# Patient Record
Sex: Female | Born: 1995 | Race: Black or African American | Hispanic: No | Marital: Single | Smoking: Never smoker
Health system: Southern US, Community
[De-identification: ages and names within clinical notes are randomized; demographics above are authoritative.]

## PROBLEM LIST (undated history)

## (undated) ENCOUNTER — Emergency Department (HOSPITAL_COMMUNITY): Discharge status: Absent without leave | Payer: Self-pay | Source: Home / Self Care

---
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
## 2021-04-05 DIAGNOSIS — U071 COVID-19: Secondary | ICD-10-CM | POA: Insufficient documentation

## 2021-08-04 ENCOUNTER — Emergency Department
Admission: EM | Admit: 2021-08-04 | Discharge: 2021-08-04 | Disposition: A | Discharge status: Home / Self Care | Payer: Medicaid Other | Attending: Emergency Medicine | Admitting: Emergency Medicine

## 2021-08-04 ENCOUNTER — Other Ambulatory Visit: Payer: Self-pay

## 2021-08-04 ENCOUNTER — Encounter: Payer: Self-pay | Admitting: Emergency Medicine

## 2021-08-04 DIAGNOSIS — L509 Urticaria, unspecified: Secondary | ICD-10-CM | POA: Insufficient documentation

## 2021-08-04 DIAGNOSIS — R21 Rash and other nonspecific skin eruption: Secondary | ICD-10-CM | POA: Diagnosis present

## 2021-08-04 MED ORDER — PREDNISONE 10 MG PO TABS: ORAL_TABLET | ORAL | 0 refills | Status: DC

## 2021-08-04 MED ORDER — FAMOTIDINE 20 MG PO TABS: 20.0000 mg | ORAL_TABLET | Freq: Two times a day (BID) | ORAL | 0 refills | Status: AC

## 2021-08-04 NOTE — ED Provider Notes (Signed)
? ?[facility] ?Provider Note ? ? ? Event Date/Time  ? First MD Initiated Contact with Patient 08/04/21 [phone]   ?  (approximate) ? ? ?History  ? ?Rash ? ? ?HPI ? ?Millena Callins is a 26 y.o. female presents to the ED with complaint of rash for the last 3 days.  Patient is not aware of any new allergens such as soap, detergents or foods.  Patient denies any previous allergy problems.  Patient has taken Benadryl with minimal relief.  She denies any shortness of breath or difficulty swallowing.  She reports that the rash is very itchy and then disappears but reappears in a different part of her body.  She denies any health problems or known allergies. ?  ? ? ?Physical Exam  ? ?Triage Vital Signs: ?ED Triage Vitals  ?Enc Vitals Group  ?   BP 08/04/21 0838 (!) 107/56  ?   Pulse Rate 08/04/21 0838 83  ?   Resp 08/04/21 0838 16  ?   Temp 08/04/21 0838 97.8 ?F (36.6 ?C)  ?   Temp Source 08/04/21 0838 Oral  ?   SpO2 08/04/21 0838 100 %  ?   Weight 08/04/21 0751 170 lb (77.1 kg)  ?   Height 08/04/21 0751 5\' 5"  (1.651 m)  ?   Head Circumference --   ?   Peak Flow --   ?   Pain Score 08/04/21 0751 0  ?   Pain Loc --   ?   Pain Edu? --   ?   Excl. in GC? --   ? ? ?Most recent vital signs: ?Vitals:  ? 08/04/21 0838  ?BP: (!) 107/56  ?Pulse: 83  ?Resp: 16  ?Temp: 97.8 ?F (36.6 ?C)  ?SpO2: 100%  ? ? ? ?General: Awake, no distress.  Able to speak in complete sentences without any difficulty. ?CV:  Good peripheral perfusion.  Heart regular rate and rhythm. ?Resp:  Normal effort.  Lungs are clear bilaterally. ?Abd:  No distention.  ?Other:  On examination of the skin there are raised, irregular macular areas diffusely over the body in various stages of erythema.  No vesicles or pustules were noted.  No edema noted oromucosa and uvula is midline. ? ? ?ED Results / Procedures / Treatments  ? ?Labs ?(all labs ordered are listed, but only abnormal results are displayed) ?Labs Reviewed - No data to  display ? ? ? ?PROCEDURES: ? ?Critical Care performed:  ? ?Procedures ? ? ?MEDICATIONS ORDERED IN ED: ?Medications - No data to display ? ? ?IMPRESSION / MDM / ASSESSMENT AND PLAN / ED COURSE  ?I reviewed the triage vital signs and the nursing notes. ? ? ?Differential diagnosis includes, but is not limited to, urticaria, contact dermatitis. ? ? ?26 year old female presents to the ED with a rash for the last 3 days that is extremely itchy.  Patient states that it comes and goes.  She denies any previous known allergies and has taken Benadryl with little relief.  On exam areas are consistent with urticaria.  We discussed the treatment of this and she is aware that the 2 medications sent to her pharmacy should help.  She is to follow-up with her PCP if any continued problems and may need to follow-up with an allergy specialist if it continues.  She is return to the emergency department if any severe worsening of her symptoms over the weekend. ? ? ?  ? ? ?FINAL CLINICAL IMPRESSION(S) / ED DIAGNOSES  ? ?  Final diagnoses:  ?Urticaria  ? ? ? ?Rx / DC Orders  ? ?ED Discharge Orders   ? ?      Ordered  ?  famotidine (PEPCID) 20 MG tablet  2 times daily       ? 08/04/21 1003  ?  predniSONE (DELTASONE) 10 MG tablet       ? 08/04/21 1003  ? ?  ?  ? ?  ? ? ? ?Note:  This document was prepared using Dragon voice recognition software and may include unintentional dictation errors. ?  ?Tommi Rumps, PA-C ?08/04/21 1009 ? ?  ?Jene Every, MD ?08/04/21 1052 ? ?

## 2021-08-04 NOTE — ED Triage Notes (Signed)
Pt via POV from home. Pt c/o itchy rash for the past 3 days. Denies pain. States it got worse this AM, denies SOB. Denies any new soaps, detergent. Pt is A&Ox4 and NAD ?

## 2021-08-04 NOTE — ED Notes (Signed)
Attempted to call patient at this time, phone number not in service. This RN visibly watched patient leave the building but did not states that she was not going to be seen.  ?

## 2021-08-04 NOTE — Discharge Instructions (Addendum)
Follow-up with your primary care provider and if this continues you may need to see an allergy specialist.  Prescriptions were sent to the pharmacy with the first one being Pepcid that you will take twice a day and the second one is prednisone that you begin taking 6 tablets today and tapering down over the next 6 days.  If any severe worsening of your symptoms return to the emergency department.  May also take Benadryl as needed for itching with these medications. ?

## 2021-08-04 NOTE — ED Notes (Signed)
Patient declined discharge vital signs. 

## 2021-08-07 ENCOUNTER — Encounter: Payer: Self-pay | Admitting: Emergency Medicine

## 2021-08-07 ENCOUNTER — Other Ambulatory Visit: Payer: Self-pay

## 2021-08-07 ENCOUNTER — Emergency Department
Admission: EM | Admit: 2021-08-07 | Discharge: 2021-08-07 | Disposition: A | Discharge status: Home / Self Care | Payer: Medicaid Other | Attending: Emergency Medicine | Admitting: Emergency Medicine

## 2021-08-07 DIAGNOSIS — T7840XA Allergy, unspecified, initial encounter: Secondary | ICD-10-CM | POA: Diagnosis not present

## 2021-08-07 NOTE — Discharge Instructions (Addendum)
-  Continue to take your prednisone, famotidine, and Benadryl as discussed. ?-Follow-up with the allergist listed above ?-You may additionally try over-the-counter hydrocortisone cream and/or Benadryl cream as well ? ?

## 2021-08-07 NOTE — ED Triage Notes (Signed)
Pt seen here Sunday for allergic reaction/Hives. States medication has not been helping. Still has hives on hands, arms and thighs.  ?

## 2021-08-07 NOTE — ED Notes (Signed)
See triage note  presents with possible allergic rxn  states she was seen 2 days ago  but feels like she is getting worse  positive itching  no resp distress ?

## 2021-08-07 NOTE — ED Provider Notes (Signed)
? ?[facility] ?Provider Note ? ? ? Event Date/Time  ? First MD Initiated Contact with Patient 08/07/21 [phone]   ?  (approximate) ? ? ?History  ? ?Chief Complaint ?Allergic Reaction ? ? ?HPI ?Brooke Hurley is a 26 y.o. female, no remarkable medical history, presents to the emergency department for evaluation of allergic reaction/hives.  Patient states that this been going on for the past week.  She was recently seen here on Sunday for the same symptoms.  She states that she is having recurrent splotches of urticaria scattered across her body, mostly in her upper extremities, abdomen, and lower extremities.  She states that she is currently taking prednisone and famotidine daily, she is in the middle of her treatment now but is not experiencing any significant relief.  Denies any new body washes, soaps, detergents, fabric softeners, or significant abdominal exposures.  Denies dysphagia, shortness of breath, chest pain, fever/chills, abdominal pain, flank pain, diarrhea, urinary symptoms, or dizziness/lightheadedness. ? ?History Limitations: No limitations. ? ?    ? ? ?Physical Exam  ?Triage Vital Signs: ?ED Triage Vitals  ?Enc Vitals Group  ?   BP 08/07/21 0250 124/74  ?   Pulse Rate 08/07/21 0250 77  ?   Resp 08/07/21 0250 16  ?   Temp 08/07/21 0250 97.8 ?F (36.6 ?C)  ?   Temp Source 08/07/21 0250 Oral  ?   SpO2 08/07/21 0250 99 %  ?   Weight 08/07/21 0305 170 lb (77.1 kg)  ?   Height 08/07/21 0305 5\' 5"  (1.651 m)  ?   Head Circumference --   ?   Peak Flow --   ?   Pain Score 08/07/21 0305 6  ?   Pain Loc --   ?   Pain Edu? --   ?   Excl. in GC? --   ? ? ?Most recent vital signs: ?Vitals:  ? 08/07/21 0250  ?BP: 124/74  ?Pulse: 77  ?Resp: 16  ?Temp: 97.8 ?F (36.6 ?C)  ?SpO2: 99%  ? ? ?General: Awake, NAD.  ?Skin: Warm, dry.  ?Eyes: PERRL. Conjunctivae normal.  ?CV: Good peripheral perfusion.  ?Resp: Normal effort.  Lung sounds are clear bilaterally in the apices and bases. ?Abd: Soft,  non-tender. No distention.  ?Neuro: At baseline. No gross neurological deficits.  ? ?Focused Exam: Patches of urticaria present along the patient's abdomen and upper extremities.  The chest/back appears to be spared at this time.  No surrounding warmth, erythema, or tenderness.  No lesions in the mouth or throat.  No stridor.  No angioedema. ? ?Physical Exam ? ? ? ?ED Results / Procedures / Treatments  ?Labs ?(all labs ordered are listed, but only abnormal results are displayed) ?Labs Reviewed - No data to display ? ? ?EKG ?N/A. ? ? ?RADIOLOGY ? ?ED Provider Interpretation: N/A. ? ?No results found. ? ?PROCEDURES: ? ?Critical Care performed: N/A. ? ?Procedures ? ? ? ?MEDICATIONS ORDERED IN ED: ?Medications - No data to display ? ? ?IMPRESSION / MDM / ASSESSMENT AND PLAN / ED COURSE  ?I reviewed the triage vital signs and the nursing notes. ?             ?               ? ?Differential diagnosis includes, but is not limited to, allergic reaction, contact dermatitis, cellulitis. ? ?ED Course ?Patient appears well, vitals within normal limits.  NAD. ? ?Assessment/Plan ?Presentation consistent with urticaria.  Unknown etiology  at this time.  No evidence of anaphylaxis.  Vital signs within normal limits.  She is currently in the middle of her prednisone and famotidine treatment.  Advised her to continue taking these treatments, as well as Benadryl as needed.  Recommended over-the-counter hydrocortisone cream and Benadryl cream as well.  We will provide her contact information to an allergist for further evaluation and management.  Will discharge. ? ?Provided the patient with anticipatory guidance, return precautions, and educational material. Encouraged the patient to return to the emergency department at any time if they begin to experience any new or worsening symptoms. Patient expressed understanding and agreed with the plan.  ? ?  ? ? ?FINAL CLINICAL IMPRESSION(S) / ED DIAGNOSES  ? ?Final diagnoses:  ?Allergic  reaction, initial encounter  ? ? ? ?Rx / DC Orders  ? ?ED Discharge Orders   ? ? None  ? ?  ? ? ? ?Note:  This document was prepared using Dragon voice recognition software and may include unintentional dictation errors. ?  ?Varney Daily, PA ?08/07/21 0730 ? ?  ?Concha Se, MD ?08/07/21 [phone] ? ?

## 2021-08-09 ENCOUNTER — Ambulatory Visit: Payer: Self-pay | Admitting: Physician Assistant

## 2021-09-29 DIAGNOSIS — Z419 Encounter for procedure for purposes other than remedying health state, unspecified: Secondary | ICD-10-CM | POA: Diagnosis not present

## 2022-08-02 ENCOUNTER — Ambulatory Visit
Admission: EM | Admit: 2022-08-02 | Discharge: 2022-08-02 | Disposition: A | Discharge status: Home / Self Care | Payer: Managed Care, Other (non HMO) | Attending: Nurse Practitioner | Admitting: Nurse Practitioner

## 2022-08-02 DIAGNOSIS — N760 Acute vaginitis: Secondary | ICD-10-CM | POA: Insufficient documentation

## 2022-08-02 LAB — POCT URINE PREGNANCY: Preg Test, Ur: NEGATIVE — AB

## 2022-08-02 MED ORDER — FLUCONAZOLE 150 MG PO TABS
150.0000 mg | ORAL_TABLET | Freq: Every day | ORAL | 0 refills | Status: DC
Start: 2022-08-02 — End: 2023-10-27

## 2022-08-02 NOTE — ED Provider Notes (Signed)
Renaldo Fiddler    CSN: [id number] Arrival date & time: 08/02/22  4098      History   Chief Complaint Chief Complaint  Patient presents with   Vaginal Itching    Entered by patient    HPI Raimey Whitefield is a 27 y.o. female presents for evaluation of vaginal itching. Pt reports 2 days of vaginal itching.  She reports discharge with itching that is not malodorous.  Denies any dysuria, fevers, nausea/vomiting, flank pain.  No STD exposure but she would like screening.  She took Monistat yesterday without improvement.  No other concerns at this time.   Vaginal Itching    History reviewed. No pertinent past medical history.  There are no problems to display for this patient.   History reviewed. No pertinent surgical history.  OB History   No obstetric history on file.      Home Medications    Prior to Admission medications   Medication Sig Start Date End Date Taking? Authorizing Provider  cetirizine (ZYRTEC) 5 MG tablet Take 5 mg by mouth daily.   Yes [provider]  fluconazole (DIFLUCAN) 150 MG tablet Take 1 tablet (150 mg total) by mouth daily. 08/02/22  Yes Radford Pax, NP  montelukast (SINGULAIR) 10 MG tablet Take 10 mg by mouth at bedtime.   Yes [provider]  famotidine (PEPCID) 20 MG tablet Take 1 tablet (20 mg total) by mouth 2 (two) times daily. 08/04/21 08/04/22  Tommi Rumps, PA-C  predniSONE (DELTASONE) 10 MG tablet Take 6 tablets  today, on day 2 take 5 tablets, day 3 take 4 tablets, day 4 take 3 tablets, day 5 take  2 tablets and 1 tablet the last day Patient not taking: Reported on 08/02/2022 08/04/21   Tommi Rumps, PA-C    Family History No family history on file.  Social History Social History   Tobacco Use   Smoking status: Never   Smokeless tobacco: Never  Substance Use Topics   Alcohol use: Not Currently   Drug use: Never     Allergies   Patient has no known allergies.   Review of Systems Review of  Systems  Genitourinary:  Positive for vaginal discharge.     Physical Exam Triage Vital Signs ED Triage Vitals  Enc Vitals Group     BP 08/02/22 1015 122/80     Pulse Rate 08/02/22 1015 90     Resp 08/02/22 1015 18     Temp 08/02/22 1015 97.9 F (36.6 C)     Temp Source 08/02/22 1015 Oral     SpO2 08/02/22 1015 98 %     Weight --      Height --      Head Circumference --      Peak Flow --      Pain Score 08/02/22 1013 0     Pain Loc --      Pain Edu? --      Excl. in GC? --    No data found.  Updated Vital Signs BP 122/80   Pulse 90   Temp 97.9 F (36.6 C) (Oral)   Resp 18   LMP 07/06/2022   SpO2 98%   Visual Acuity Right Eye Distance:   Left Eye Distance:   Bilateral Distance:    Right Eye Near:   Left Eye Near:    Bilateral Near:     Physical Exam Vitals and nursing note reviewed.  Constitutional:  Appearance: Normal appearance.  HENT:     Head: Normocephalic and atraumatic.  Eyes:     Pupils: Pupils are equal, round, and reactive to light.  Cardiovascular:     Rate and Rhythm: Normal rate.  Pulmonary:     Effort: Pulmonary effort is normal.  Abdominal:     Tenderness: There is no right CVA tenderness or left CVA tenderness.  Skin:    General: Skin is warm and dry.  Neurological:     General: No focal deficit present.     Mental Status: She is alert and oriented to person, place, and time.  Psychiatric:        Mood and Affect: Mood normal.        Behavior: Behavior normal.      UC Treatments / Results  Labs (all labs ordered are listed, but only abnormal results are displayed) Labs Reviewed  POCT URINE PREGNANCY - Abnormal; Notable for the following components:      Result Value   Preg Test, Ur Negative (*)    All other components within normal limits  RPR  HIV ANTIBODY (ROUTINE TESTING W REFLEX)  CERVICOVAGINAL ANCILLARY ONLY    EKG   Radiology No results found.  Procedures Procedures (including critical care  time)  Medications Ordered in UC Medications - No data to display  Initial Impression / Assessment and Plan / UC Course  I have reviewed the triage vital signs and the nursing notes.  Pertinent labs & imaging results that were available during my care of the patient were reviewed by me and considered in my medical decision making (see chart for details).     Reviewed exam and symptoms with patient.  No red flags. STD testing is ordered and will contact if positive Diflucan as prescribed Follow-up with PCP if symptoms do not improve ER precautions reviewed Final Clinical Impressions(s) / UC Diagnoses   Final diagnoses:  Acute vaginitis     Discharge Instructions      The clinic will contact you with results of the testing done today if positive Diflucan x 1 to treat yeast infection Follow-up with your PCP if symptoms or not improving    ED Prescriptions     Medication Sig Dispense Auth. Provider   fluconazole (DIFLUCAN) 150 MG tablet Take 1 tablet (150 mg total) by mouth daily. 1 tablet Radford Pax, NP      PDMP not reviewed this encounter.   Radford Pax, NP 08/02/22 1029

## 2022-08-02 NOTE — Discharge Instructions (Addendum)
The clinic will contact you with results of the testing done today if positive Diflucan x 1 to treat yeast infection Follow-up with your PCP if symptoms or not improving

## 2022-08-02 NOTE — ED Triage Notes (Signed)
Patient to Urgent Care with complaints of vaginal itching that started on Wednesday.   Used Monostat yesterday.

## 2022-08-03 LAB — HIV ANTIBODY (ROUTINE TESTING W REFLEX): HIV Screen 4th Generation wRfx: NONREACTIVE

## 2022-08-03 LAB — RPR: RPR Ser Ql: NONREACTIVE

## 2022-08-05 ENCOUNTER — Telehealth (HOSPITAL_COMMUNITY): Payer: Self-pay | Admitting: Emergency Medicine

## 2022-08-05 LAB — CERVICOVAGINAL ANCILLARY ONLY
Bacterial Vaginitis (gardnerella): POSITIVE — AB
Candida Glabrata: NEGATIVE
Candida Vaginitis: POSITIVE — AB
Chlamydia: NEGATIVE
Comment: NEGATIVE
Comment: NEGATIVE
Comment: NEGATIVE
Comment: NEGATIVE
Comment: NEGATIVE
Comment: NORMAL
Neisseria Gonorrhea: NEGATIVE
Trichomonas: NEGATIVE

## 2022-08-05 MED ORDER — METRONIDAZOLE 500 MG PO TABS: 500.0000 mg | ORAL_TABLET | Freq: Two times a day (BID) | ORAL | 0 refills | Status: DC

## 2023-10-27 ENCOUNTER — Other Ambulatory Visit: Payer: Self-pay

## 2023-10-27 ENCOUNTER — Encounter: Payer: Self-pay | Admitting: Emergency Medicine

## 2023-10-27 ENCOUNTER — Emergency Department

## 2023-10-27 ENCOUNTER — Emergency Department
Admission: EM | Admit: 2023-10-27 | Discharge: 2023-10-27 | Disposition: A | Discharge status: Home / Self Care | Attending: Emergency Medicine | Admitting: Emergency Medicine

## 2023-10-27 DIAGNOSIS — S93401A Sprain of unspecified ligament of right ankle, initial encounter: Secondary | ICD-10-CM | POA: Diagnosis not present

## 2023-10-27 DIAGNOSIS — S92254A Nondisplaced fracture of navicular [scaphoid] of right foot, initial encounter for closed fracture: Secondary | ICD-10-CM | POA: Insufficient documentation

## 2023-10-27 DIAGNOSIS — W109XXA Fall (on) (from) unspecified stairs and steps, initial encounter: Secondary | ICD-10-CM | POA: Insufficient documentation

## 2023-10-27 DIAGNOSIS — S99921A Unspecified injury of right foot, initial encounter: Secondary | ICD-10-CM | POA: Diagnosis present

## 2023-10-27 MED ORDER — IBUPROFEN 600 MG PO TABS: 600.0000 mg | ORAL_TABLET | Freq: Three times a day (TID) | ORAL | 0 refills | Status: AC | PRN

## 2023-10-27 NOTE — ED Triage Notes (Signed)
 Patient to ED via POV for right ankle injury. Pt reports falling down approx 10 stairs. Denies hitting head. States unable to put pressure on ankle. Using crutches on arrival to ED.

## 2023-10-27 NOTE — Discharge Instructions (Addendum)
 Call today and make an appointment with Dr. Malvin who also has office hours in [place].  Make sure that when you are calling the office that you identify that you need an appointment in [place] instead of [place].  Ice and elevation if needed for pain and swelling.  Wear the cam walker anytime you are up walking.  You do not have to wear it while you are sleeping.  Ibuprofen  was sent to the pharmacy for you to take with food daily to help with inflammation which will also help with pain.

## 2023-10-27 NOTE — ED Provider Notes (Signed)
 [facility] Provider Note    Event Date/Time   First MD Initiated Contact with Patient 10/27/23 0848     (approximate)   History   Ankle Pain   HPI  Brooke Hurley is a 28 y.o. female  presents to the ED with injury to foot/ ankle with injury on Saturday.  Patient reports that she fell down approximately 10 steps.  She denies any head injury or loss of consciousness.  She reports that there is increased pain with weightbearing.  Over-the-counter medication has been taken with minimal relief.  Patient denies any medical history.      Physical Exam   Triage Vital Signs: ED Triage Vitals  Encounter Vitals Group     BP 10/27/23 0831 108/65     Girls Systolic BP Percentile --      Girls Diastolic BP Percentile --      Boys Systolic BP Percentile --      Boys Diastolic BP Percentile --      Pulse Rate 10/27/23 0831 87     Resp 10/27/23 0831 17     Temp 10/27/23 0831 98.6 F (37 C)     Temp Source 10/27/23 0831 Oral     SpO2 10/27/23 0831 100 %     Weight 10/27/23 0829 220 lb (99.8 kg)     Height 10/27/23 0829 5' 6 (1.676 m)     Head Circumference --      Peak Flow --      Pain Score 10/27/23 0829 10     Pain Loc --      Pain Education --      Exclude from Growth Chart --     Most recent vital signs: Vitals:   10/27/23 0831  BP: 108/65  Pulse: 87  Resp: 17  Temp: 98.6 F (37 C)  SpO2: 100%     General: Awake, no distress.  CV:  Good peripheral perfusion.  Resp:  Normal effort.  Abd:  No distention.  Other:  On examination of the right foot and ankle there is no gross deformity however there is moderate soft tissue tenderness and some minimal edema noted to the lateral aspect.  Moderate tenderness on palpation of the distal malleolus and dorsal area.  There is an area of resolving ecchymosis noted to the lateral side measuring approximately 2 cm.  Pulses are present.  Digits distally with positive motor or sensory function intact.   Capillary fill less than 3 seconds.  Range of motion is slow and guarded secondary to increased pain.   ED Results / Procedures / Treatments   Labs (all labs ordered are listed, but only abnormal results are displayed) Labs Reviewed - No data to display    RADIOLOGY  Right ankle x-ray images were reviewed and interpreted by self independent the radiologist with some degenerative changes noted.  Official radiology report states acute minimal distracted fracture of the dorsal navicular with cortical irregularity of the anterior calcaneal process suspicious for fracture.  CT right foot without contrast per radiology suggesting a ligamentous tear.  Small avulsion fracture of the navicular dorsally.  4 mm linear calcification of the anterior lateral margin of the anterior process of the calcaneus that could represent a tiny avulsion fracture.  Subcutaneous edema along the heel and plantar foot.   PROCEDURES:  Critical Care performed:   Procedures   MEDICATIONS ORDERED IN ED: Medications - No data to display   IMPRESSION / MDM / ASSESSMENT AND PLAN /  ED COURSE  I reviewed the triage vital signs and the nursing notes.   Differential diagnosis includes, but is not limited to, sprain right ankle, fracture, dislocation, ligamentous injury, tear secondary to fall.  28 year old female presents to the ED with complaint of right foot and ankle pain after an injury that occurred where she fell down approximately 10 steps. X-rays of the right ankle were performed and was questionable for a nondisplaced fracture of the navicular area.  CT was recommended and done which did suggest a nondisplaced navicular fracture as noted above.  Patient was placed in a cam walker, ice, elevation, ibuprofen  every 8 hours with food and to follow-up with Dr. Malvin who is on-call for podiatry.  Patient was given note explaining that she would need to wear the cam walker until her appointment with the  podiatrist.      Patient's presentation is most consistent with acute complicated illness / injury requiring diagnostic workup.  FINAL CLINICAL IMPRESSION(S) / ED DIAGNOSES   Final diagnoses:  Closed nondisplaced fracture of navicular bone of right foot, initial encounter  Sprain of right ankle, unspecified ligament, initial encounter     Rx / DC Orders   ED Discharge Orders          Ordered    ibuprofen  (ADVIL ) 600 MG tablet  Every 8 hours PRN        10/27/23 1118             Note:  This document was prepared using Dragon voice recognition software and may include unintentional dictation errors.   Saunders Shona CROME, PA-C 10/27/23 1241    Brooke Rover, MD 10/27/23 [phone]

## 2023-10-29 ENCOUNTER — Ambulatory Visit: Payer: Self-pay | Admitting: Podiatry

## 2023-10-29 ENCOUNTER — Encounter: Payer: Self-pay | Admitting: Podiatry

## 2023-10-29 DIAGNOSIS — S92252A Displaced fracture of navicular [scaphoid] of left foot, initial encounter for closed fracture: Secondary | ICD-10-CM

## 2023-10-29 DIAGNOSIS — S92022A Displaced fracture of anterior process of left calcaneus, initial encounter for closed fracture: Secondary | ICD-10-CM

## 2023-10-30 ENCOUNTER — Telehealth: Payer: Self-pay | Admitting: Podiatry

## 2023-10-30 ENCOUNTER — Encounter: Payer: Self-pay | Admitting: Podiatry

## 2023-10-30 DIAGNOSIS — Z0271 Encounter for disability determination: Secondary | ICD-10-CM

## 2023-10-30 NOTE — Telephone Encounter (Signed)
 Recd forms from [employer] faxed [phone] form/notes/letter. Pt OOW 10/28/23-12/08/23

## 2023-10-30 NOTE — Progress Notes (Signed)
 Subjective:  Patient ID: Brooke Hurley, female    DOB: 04/13/1995,  MRN: [id number]  No chief complaint on file.   Discussed the use of AI scribe software for clinical note transcription with the patient, who gave verbal consent to proceed.  History of Present Illness Brooke Hurley is a 28 year old female who presents with foot pain following a fall down the stairs.  She fell down the stairs on Saturday, resulting in increased pain on the medial side of her foot and ankle. She has a minor abrasion on her knee. She denies any other injuries.  She has been using crutches for support and was provided with a boot to immobilize the foot. She is currently taking ibuprofen  400 mg every eight hours for pain management, but the pain has not improved since the injury.  She recalls a previous foot fracture during high school but is uncertain which foot was affected. She has not experienced recent problems with her foot prior to this incident.  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 Due to her current condition, she is unable to perform her work duties.      Objective:    Physical Exam VASCULAR: DP and PT pulse palpable. Foot is warm and well-perfused. Capillary fill time is brisk. DERMATOLOGIC: Normal skin turgor, texture, and temperature. No open lesions, rashes, or ulcerations. NEUROLOGIC: Normal sensation to light touch and pressure. No paresthesias on examination. ORTHOPEDIC: Medial pain over dorsal talonavicular joint, sinus tarsus, calcaneocuboid joint, and anterior process. Abrasion on knee. Smooth pain-free range of motion of all examined joints. No ecchymosis or bruising. No gross deformity.   No images are attached to the encounter.    Results RADIOLOGY CT scan: Avulsion fracture of dorsal navicular and anterior process of talus (10/25/2023)   Assessment:   1. Closed avulsion fracture of navicular  bone of left foot, initial encounter   2. Closed displaced fracture of anterior process of left calcaneus, initial encounter      Plan:  Patient was evaluated and treated and all questions answered.  Assessment and Plan Assessment & Plan Right foot avulsion fractures Avulsion fractures of the dorsal navicular and anterior process of the talus, identified on radiographs and CT scan. No dislocation present. Nonoperative treatment is appropriate due to the small size of the fractures. Pain is primarily due to inflammation and swelling. - Instruct to wear a cam walker boot as much as possible for the next month, treating it like a cast. - Allow weight bearing as tolerated with crutches for support if walking is painful. - Prescribe 800 mg ibuprofen  every 8 hours, alternating with 1000 mg Tylenol every 4 hours for pain management. - Provide a prescription for 800 mg ibuprofen  to reduce pill burden. - Instruct to keep the foot iced and elevated to reduce swelling. - Demonstrate how to wrap the foot with an Ace wrap for support. - Reevaluate in one month to assess healing progress. - If pain and swelling persist, consider MRI for further evaluation and initiate physical therapy.  Right ankle ligament sprain/partial tear Possible sprain or partial tear of the right ankle ligament, likely due to the fall. No immediate need for surgical intervention unless symptoms persist or worsen. If an MRI were performed on 100 individuals with an ankle sprain, 80 would show a torn ligament, but most do not require surgery unless problems develop later. - Monitor symptoms and reassess in one month. -  If symptoms improve, transition to a lace-up ankle brace and begin physical therapy to improve strength and range of motion. - If symptoms persist, consider MRI for further evaluation.  Right knee abrasion Minor abrasion on the right knee from the fall.      Return in about 1 month (around 11/29/2023) for foot  injury f/u (new ankle and foot XR if painful).

## 2023-11-11 ENCOUNTER — Ambulatory Visit: Payer: Self-pay

## 2023-11-26 ENCOUNTER — Encounter: Payer: Self-pay | Admitting: Podiatry

## 2023-11-26 ENCOUNTER — Ambulatory Visit: Admitting: Podiatry

## 2023-11-26 VITALS — Ht 66.0 in | Wt 220.0 lb

## 2023-11-26 DIAGNOSIS — S92022D Displaced fracture of anterior process of left calcaneus, subsequent encounter for fracture with routine healing: Secondary | ICD-10-CM | POA: Diagnosis not present

## 2023-11-26 DIAGNOSIS — S92252D Displaced fracture of navicular [scaphoid] of left foot, subsequent encounter for fracture with routine healing: Secondary | ICD-10-CM

## 2023-11-26 NOTE — Progress Notes (Signed)
  Subjective:  Patient ID: Brooke Hurley, female    DOB: September 17, 1995,  MRN: [id number]  Chief Complaint  Patient presents with   Fracture    Rm 1 Patient is here for a follow-up of right navicular bone fracture. Patient states minimum pain when walking w/o boot. Patient states intermittent shooting pain when at rest, but overall improvement in pain and discomfort from fracture.    Discussed the use of AI scribe software for clinical note transcription with the patient, who gave verbal consent to proceed.  History of Present Illness Brooke Hurley is a 28 year old female who returns for follow-up has been wearing a boot for 1 month for closed treatment of avulsion fractures of the navicular and calcaneus.  She is doing much better      Objective:    Physical Exam VASCULAR: DP and PT pulse palpable. Foot is warm and well-perfused. Capillary fill time is brisk. DERMATOLOGIC: Normal skin turgor, texture, and temperature. No open lesions, rashes, or ulcerations. NEUROLOGIC: Normal sensation to light touch and pressure. No paresthesias on examination. ORTHOPEDIC: No pain to palpation today.  Full range of motion 5 out of 5 strength without pain   No images are attached to the encounter.    Results RADIOLOGY CT scan: Avulsion fracture of dorsal navicular and anterior process of talus (10/25/2023)   Assessment:   1. Closed avulsion fracture of navicular bone of left foot, initial encounter   2. Closed displaced fracture of anterior process of left calcaneus, initial encounter      Plan:  Patient was evaluated and treated and all questions answered.  Assessment and Plan Assessment & Plan Right foot avulsion fractures Improved quite a bit.  Will begin transitioning to a lace up ankle brace in a regular shoe gear.  We discussed that if she has increasing pain or swelling would like to see her back in 1 month for x-rays and we will delay her return to work.  Otherwise if she is  doing well without pain she may continue weightbearing as tolerated in the lace up ankle brace and regular shoe for the next month.  Should not need after that.  She will let me know how she is doing if she needs further accommodations for work.      No follow-ups on file.

## 2023-12-05 ENCOUNTER — Telehealth: Payer: Self-pay | Admitting: Podiatry

## 2023-12-05 NOTE — Telephone Encounter (Signed)
 See prev note

## 2023-12-05 NOTE — Telephone Encounter (Signed)
Will respond via email as well.

## 2023-12-08 ENCOUNTER — Telehealth: Payer: Self-pay | Admitting: Podiatry

## 2023-12-08 DIAGNOSIS — Z0271 Encounter for disability determination: Secondary | ICD-10-CM

## 2023-12-08 NOTE — Telephone Encounter (Signed)
 pt lft mess on my vmail. I cld bk and she adv not ready to go back 12/30/23. I adv would revise to 02/02/24 and email to her. No fax to send to

## 2023-12-11 ENCOUNTER — Telehealth: Payer: Self-pay | Admitting: Podiatry

## 2023-12-11 DIAGNOSIS — Z0271 Encounter for disability determination: Secondary | ICD-10-CM

## 2023-12-11 NOTE — Telephone Encounter (Signed)
 Recd [employer] form. Faxed [phone]  Revised RTW 02/02/24

## 2024-01-02 ENCOUNTER — Telehealth: Payer: Self-pay | Admitting: Podiatry

## 2024-01-02 NOTE — Telephone Encounter (Signed)
 pt emailed form to adv she is ready to go back to work 01/06/24. I will email back to her since no fax to send to her job

## 2024-01-07 ENCOUNTER — Telehealth: Payer: Self-pay | Admitting: Podiatry

## 2024-01-07 ENCOUNTER — Encounter: Admitting: Obstetrics & Gynecology

## 2024-01-07 NOTE — Telephone Encounter (Signed)
 pt lft mess that may need to do form again. It indicates no restrictions, but they didn't like boxes crossed out! I adv would redo or she can send me a new copy and would email bk to her

## 2024-01-09 ENCOUNTER — Encounter: Admitting: Licensed Practical Nurse

## 2024-01-09 DIAGNOSIS — Z3201 Encounter for pregnancy test, result positive: Secondary | ICD-10-CM | POA: Insufficient documentation

## 2024-01-09 DIAGNOSIS — L501 Idiopathic urticaria: Secondary | ICD-10-CM | POA: Insufficient documentation

## 2024-01-09 DIAGNOSIS — E66812 Obesity, class 2: Secondary | ICD-10-CM | POA: Insufficient documentation

## 2024-01-09 DIAGNOSIS — R1030 Lower abdominal pain, unspecified: Secondary | ICD-10-CM | POA: Insufficient documentation

## 2024-01-09 DIAGNOSIS — M7650 Patellar tendinitis, unspecified knee: Secondary | ICD-10-CM | POA: Insufficient documentation

## 2024-01-09 DIAGNOSIS — Z0110 Encounter for examination of ears and hearing without abnormal findings: Secondary | ICD-10-CM | POA: Insufficient documentation

## 2024-01-13 ENCOUNTER — Telehealth: Payer: Self-pay

## 2024-01-13 NOTE — Telephone Encounter (Signed)
 Left VM and sent MyChart message to let pt know Dr Abbey will not be in the office for their New Pt visit tomorrow, 01/14/24. Appt will need to be rescheduled.  E2C2, if pt calls back, please reschedule their NP visit. -kh

## 2024-01-14 ENCOUNTER — Ambulatory Visit

## 2024-02-04 NOTE — Progress Notes (Unsigned)
    GYNECOLOGY PROCEDURE NOTE  Nexplanon removal discussed in detail.  Risks of infection, bleeding, nerve injury all reviewed.  Patient understands risks and desires to proceed.  Verbal consent obtained.  Patient is certain she wants the Nexplanon removed.  All questions answered.  ROS  Vital Signs: There were no vitals taken for this visit. Constitutional: Well nourished, well developed female in no acute distress.  Skin: Warm and dry.  Cardiovascular: Regular rate and rhythm.   Extremity: {extremity zkjf:[id number]}  Respiratory:  Normal respiratory effort Psych: Alert and Oriented x3. No memory deficits. Normal mood and affect.    Procedure: Patient placed in dorsal supine with left arm above head, elbow flexed at 90 degrees, arm resting on examination table.  Nexplanon identified without problems.  Betadine scrub x3.  1 ml of 1% lidocaine injected under Nexplanon device without problems.  Sterile gloves applied.  Small 0.5cm incision made at distal tip of Nexplanon device with 11 blade scalpel.  Nexplanon brought to incision and grasped with a small kelly clamp.  Nexplanon removed intact without problems.  Pressure applied to incision.  Hemostasis obtained.  Steri-strips applied, followed by bandage and compression dressing.  Patient tolerated procedure well.  No complications.   Assessment: 28 y.o. year old female now s/p uncomplicated Nexplanon removal.  Plan: 1.  Patient given post procedure precautions and asked to call for fever, chills, redness or drainage from her incision, bleeding from incision.  She understands she will likely have a small bruise near site of removal and can remove bandage tomorrow and steri-strips in approximately 1 week.  2) Contraception***   Slater Rains, CNM [facility] 02/04/2024 12:01 PM   J2001 for lidocaine block, 11982 for nexplanon removal

## 2024-02-05 ENCOUNTER — Encounter: Payer: Self-pay | Admitting: Advanced Practice Midwife

## 2024-02-05 ENCOUNTER — Ambulatory Visit (INDEPENDENT_AMBULATORY_CARE_PROVIDER_SITE_OTHER): Admitting: Advanced Practice Midwife

## 2024-02-05 VITALS — BP 121/67 | HR 72 | Resp 16 | Ht 66.0 in | Wt 226.7 lb

## 2024-02-05 DIAGNOSIS — Z3046 Encounter for surveillance of implantable subdermal contraceptive: Secondary | ICD-10-CM

## 2024-05-03 ENCOUNTER — Encounter: Payer: Self-pay | Admitting: Podiatry

## 2024-05-05 ENCOUNTER — Ambulatory Visit

## 2024-05-05 ENCOUNTER — Ambulatory Visit: Admitting: Podiatry

## 2024-05-05 VITALS — Ht 66.0 in | Wt 226.0 lb

## 2024-05-05 DIAGNOSIS — S92252D Displaced fracture of navicular [scaphoid] of left foot, subsequent encounter for fracture with routine healing: Secondary | ICD-10-CM

## 2024-05-05 DIAGNOSIS — M778 Other enthesopathies, not elsewhere classified: Secondary | ICD-10-CM

## 2024-05-05 DIAGNOSIS — S86112A Strain of other muscle(s) and tendon(s) of posterior muscle group at lower leg level, left leg, initial encounter: Secondary | ICD-10-CM

## 2024-05-05 NOTE — Patient Instructions (Addendum)
 Call [facility] to schedule your MRI at the below locations.  Please allow at least 1 business day after your visit to process the referral.  It may take longer depending on approval from insurance.  Please let me know if you have issues or problems scheduling the MRI   [facility] [phone] [address] [place] [address] [place], [place] [address]  [facility] [phone] [address] [place], [place] [address]     VISIT SUMMARY: During your visit, we discussed your persistent and worsening right foot pain following your previous injury. We reviewed your symptoms, including the pain's location and triggers, and discussed the next steps for further evaluation and management.  YOUR PLAN: -CLOSED AVULSION FRACTURE OF NAVICULAR BONE OF RIGHT FOOT: An avulsion fracture occurs when a small piece of bone is pulled off by a tendon or ligament. Your imaging shows a prominent navicular bone, which may be contributing to delayed healing. We have ordered an MRI to check for tendon tearing or non-union. You are prescribed meloxicam 15 mg once daily for pain and inflammation, and advised to use ice and elevation for relief. Please notify us  if the pain prevents you from working, as we may need to provide work leave. We will follow up 1-2 weeks after the MRI to review the results.  -ACQUIRED FLATFOOT WITH NAVICULAR TUBEROSITY PAIN, RIGHT FOOT: Acquired flatfoot deformity is a condition where the arch of the foot collapses, often causing pain. This may be worsened by the accessory navicular bone and your prior injury. We discussed that surgical intervention might be necessary if the MRI shows persistent issues. Continue using anti-inflammatories, ice, and elevation. If needed, we may refer you to physical therapy, depending on your insurance requirements.  INSTRUCTIONS: Please schedule your MRI and follow up with us  1-2 weeks after the MRI to review the results.  If the pain prevents you from working, notify us  immediately.    Contains text generated by [employer].

## 2024-05-05 NOTE — Progress Notes (Signed)
 "  Subjective:  Patient ID: Brooke Hurley, female    DOB: 10/21/1995,  MRN: [id number]  Chief Complaint  Patient presents with   Fracture    RM 2 Patient is here to f/u on fracture of the right foot. Pt states pain on the medial side of the right ankle (proximal to the tibial tendon). Pt states discontinuing brace due to discomfort while wearing work boots.    Discussed the use of AI scribe software for clinical note transcription with the patient, who gave verbal consent to proceed.  History of Present Illness Brooke Hurley is a 29 year old female with prior right foot anterior talus and navicular fractures who presents with persistent and worsening right foot pain.  She sustained a right foot injury after a fall down stairs, with imaging on July 28th demonstrating ligamentous navicular avulsion fractures. Initial management included one month of immobilization in a CAM walker boot, resulting in significant improvement, followed by transition to a lace-up ankle brace.  Since her last follow-up, she has experienced recurrence and gradual worsening of pain, described as persistent, achy, and sharp, localized to the medial aspect of the right foot near the prior injury site. The pain is exacerbated by pressure from her work boot, particularly over the navicular region, and persists despite prior improvement. 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  She is no longer using the brace regularly due to discomfort from her work boot. She denies instability, swelling, or other associated symptoms. She has not been taking anti-inflammatory medications regularly.      Objective:    Physical Exam VASCULAR: DP and PT pulse palpable. Foot is warm and well-perfused. Capillary fill time is brisk. DERMATOLOGIC: Normal skin turgor, texture, and temperature. No open lesions, rashes, or ulcerations. NEUROLOGIC: Normal sensation  to light touch and pressure. No paresthesias. ORTHOPEDIC: Prominent navicular tuberosity with pain to palpation. Pain with resisted plantar flexion and inversion. Past plantar deformity. No gross instability. Smooth pain-free range of motion of all examined joints. No ecchymosis or bruising. No gross deformity.       Results Radiology Right foot X-ray (10/29/2023): Anterior process of the calcaneus and navicular avulsion fractures (Independently interpreted) Right foot CT (10/29/2023): 1. Marked thickening and edema along the tibiospring and tibionavicular portion of the deltoid ligament and the superomedial portion of the spring ligament, favoring ligamentous tear. Cannot exclude adjacent distal tibialis posterior tendinopathy. 2. Small avulsion fracture of the navicular dorsally at the attachment site of the dorsal talonavicular ligament. 3. 4 mm linear calcification along the anterolateral margin of the anterior process of the calcaneus could conceivably represent a tiny avulsion fracture but no donor site is evident and this could simply be a tiny chronically fragmented spur. 4. Subcutaneous edema along the heel and plantar foot.   Assessment:     ICD-10-CM   1. Closed avulsion fracture of navicular bone of left foot with routine healing, subsequent encounter  S92.252D MR ANKLE RIGHT WO CONTRAST    CANCELED: DG Foot Complete Left    2. Posterior tibial tendon tear, traumatic, left, initial encounter  S86.112A DG Foot Complete Right    MR ANKLE RIGHT WO CONTRAST        Plan:  Patient was evaluated and treated and all questions answered.  Assessment and Plan Assessment & Plan Closed avulsion fracture of navicular bone of left foot Persistent and worsening medial foot pain following left navicular avulsion fracture,  with pain at the navicular tuberosity and on resisted plantar flexion and inversion. Imaging demonstrates a prominent navicular bone, possibly an accessory bone,  contributing to delayed healing.  CT scan also showed some thickening of the deltoid and spring ligament which could be related to partial or full tearing MRI is indicated to assess for posterior tibial tendon tearing, ligamentous injury and/or or avulsion non-union. Surgical intervention may be considered if persistent pathology or non-union is identified. - Ordered right foot MRI to evaluate for tendon tearing or non-union. - Prescribed meloxicam 15 mg once daily for pain and inflammation; instructed to take prior to work shift for optimal effect. - Advised ice and elevation for symptomatic relief. - Provided MRI facility contact information and instructions to call with scheduled date. 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 - Planned follow-up 1-2 weeks after MRI to review results.  Acquired flatfoot with navicular tuberosity pain, left foot Acquired flatfoot deformity with prominent navicular tuberosity and pain, likely exacerbated by accessory navicular bone and prior injury. Correction of the underlying pathology may improve the flatfoot deformity. Surgical intervention may be considered if MRI reveals persistent pathology or non-union. - Discussed that surgical intervention may be considered if MRI reveals persistent pathology or non-union. - Advised continued use of anti-inflammatories and conservative measures (ice, elevation). - Discussed that correction of the underlying pathology may improve the flatfoot deformity. - Conditional plan to refer to physical therapy if insurance requires prior authorization before MRI.      No follow-ups on file.   "

## 2024-05-12 ENCOUNTER — Other Ambulatory Visit
